# Patient Record
Sex: Female | Born: 1965 | Hispanic: Yes | Marital: Single
Health system: Southern US, Community
[De-identification: ages and names within clinical notes are randomized; demographics above are authoritative.]

---

## 2000-08-24 HISTORY — PX: AUGMENTATION MAMMAPLASTY: SUR837

## 2008-05-15 ENCOUNTER — Ambulatory Visit: Payer: Self-pay | Admitting: Family Medicine

## 2012-07-04 ENCOUNTER — Ambulatory Visit: Payer: Self-pay | Admitting: Family Medicine

## 2013-07-03 ENCOUNTER — Ambulatory Visit: Payer: Self-pay

## 2014-07-25 ENCOUNTER — Ambulatory Visit: Payer: Self-pay

## 2016-01-27 ENCOUNTER — Ambulatory Visit: Payer: Self-pay | Attending: Oncology | Admitting: *Deleted

## 2016-01-27 ENCOUNTER — Other Ambulatory Visit: Payer: Self-pay | Admitting: Oncology

## 2016-01-27 ENCOUNTER — Encounter: Payer: Self-pay | Admitting: *Deleted

## 2016-01-27 ENCOUNTER — Ambulatory Visit
Admission: RE | Admit: 2016-01-27 | Discharge: 2016-01-27 | Disposition: A | Discharge status: Home / Self Care | Payer: Self-pay | Source: Ambulatory Visit | Attending: Oncology | Admitting: Oncology

## 2016-01-27 VITALS — BP 110/84 | HR 76 | Temp 97.7°F | Ht 61.02 in | Wt 160.8 lb

## 2016-01-27 DIAGNOSIS — Z0000 Encounter for general adult medical examination without abnormal findings: Secondary | ICD-10-CM

## 2016-01-27 NOTE — Progress Notes (Signed)
Subjective:     Patient ID: Joan Sanders, female   DOB: 1966-04-14, 50 y.o.   MRN: [id number]  HPI   Review of Systems     Objective:   Physical Exam  Pulmonary/Chest: Right breast exhibits no inverted nipple, no mass, no nipple discharge, no skin change and no tenderness. Left breast exhibits no inverted nipple, no mass, no nipple discharge, no skin change and no tenderness. Breasts are symmetrical.         Assessment:  50 year old English speaking Hispanic [facility].  Last mammo on 07/25/14 was a birads 1.  Clinical breast exam unremarkable.  Taught self breast awareness.  Patient has been screened for eligibility.  She does not have any insurance, Medicare or Medicaid.  She also meets financial eligibility.  Hand-out given on the Affordable Care Act.     Plan:     Screening mammogram ordered.  Patient scheduled to return on 02/05/16 @ 1:00 for her pap smear.  Will follow-up per [facility] protocol.

## 2016-01-27 NOTE — Patient Instructions (Signed)
Gave patient hand-out, Women Staying Healthy, Active and Well from [facility], with education on breast health, pap smears, heart and colon health. 

## 2016-02-05 ENCOUNTER — Ambulatory Visit: Payer: Self-pay | Attending: Internal Medicine

## 2016-02-05 DIAGNOSIS — Z0000 Encounter for general adult medical examination without abnormal findings: Secondary | ICD-10-CM

## 2016-02-05 NOTE — Progress Notes (Signed)
Subjective:     Patient ID: Joan Sanders, female   DOB: 24-Dec-1965, 50 y.o.   MRN: [id number]  HPI   Review of Systems     Objective:   Physical Exam  Genitourinary: No labial fusion. There is no rash, tenderness, lesion or injury on the right labia. There is no rash, tenderness, lesion or injury on the left labia. Uterus is not deviated, not enlarged, not fixed and not tender. Cervix exhibits no motion tenderness, no discharge and no friability. Right adnexum displays no mass, no tenderness and no fullness. Left adnexum displays no mass, no tenderness and no fullness. No erythema, tenderness or bleeding in the vagina. No foreign body around the vagina. No signs of injury around the vagina. No vaginal discharge found.       Assessment:     50 year old female returns for pap. She was on menstrual cycle when she came for screening.  Pelvic exam normal.     Plan:    Specimen collected for pap.

## 2016-02-07 ENCOUNTER — Encounter: Payer: Self-pay | Admitting: *Deleted

## 2016-02-07 LAB — PAP LB AND HPV HIGH-RISK
HPV, high-risk: NEGATIVE
PAP Smear Comment: 0

## 2016-02-07 NOTE — Progress Notes (Signed)
Letter mailed from the [facility] to inform patient of her normal mammogram results.  Patient is to follow-up with annual screening in one year.  [facility] to Christy. 

## 2016-02-16 NOTE — Progress Notes (Signed)
Letter mailed to patient to notify of normal HPV negative pap smear results.  She is to return in one year for [facility] screening.  Per [facility] guidelines her next pap is due in 5 years. Copy to [facility].

## 2018-06-06 ENCOUNTER — Ambulatory Visit: Payer: Self-pay

## 2018-07-18 ENCOUNTER — Ambulatory Visit: Payer: Self-pay

## 2018-07-29 ENCOUNTER — Encounter: Payer: Self-pay | Admitting: Family Medicine

## 2019-07-10 ENCOUNTER — Other Ambulatory Visit: Payer: Self-pay

## 2019-07-12 ENCOUNTER — Ambulatory Visit: Payer: Self-pay | Attending: Oncology | Admitting: *Deleted

## 2019-07-12 ENCOUNTER — Other Ambulatory Visit: Payer: Self-pay

## 2019-07-12 ENCOUNTER — Encounter: Payer: Self-pay | Admitting: *Deleted

## 2019-07-12 ENCOUNTER — Other Ambulatory Visit: Payer: Self-pay | Admitting: *Deleted

## 2019-07-12 ENCOUNTER — Ambulatory Visit
Admission: RE | Admit: 2019-07-12 | Discharge: 2019-07-12 | Disposition: A | Discharge status: Home / Self Care | Payer: Self-pay | Source: Ambulatory Visit | Attending: Oncology | Admitting: Oncology

## 2019-07-12 ENCOUNTER — Encounter (INDEPENDENT_AMBULATORY_CARE_PROVIDER_SITE_OTHER): Payer: Self-pay

## 2019-07-12 VITALS — BP 133/73 | HR 83 | Temp 98.0°F | Resp 16 | Ht 62.5 in | Wt 169.5 lb

## 2019-07-12 DIAGNOSIS — Z0000 Encounter for general adult medical examination without abnormal findings: Secondary | ICD-10-CM

## 2019-07-12 NOTE — Progress Notes (Signed)
  Subjective:     Patient ID: Joan Sanders, female   DOB: 03/13/1966, 53 y.o.   MRN: [id number]  HPI   Review of Systems     Objective:   Physical Exam Chest:     Breasts:        Right: No swelling, bleeding, inverted nipple, mass, nipple discharge, skin change or tenderness.        Left: No swelling, bleeding, inverted nipple, mass, nipple discharge, skin change or tenderness.    Lymphadenopathy:     Upper Body:     Right upper body: No supraclavicular or axillary adenopathy.     Left upper body: No supraclavicular or axillary adenopathy.        Assessment:     53 year old Hispanic female returns to [facility] for clinical breast exam and mammogram.  Joan Sanders, the interpreter present during the interview and exam.  Clinical breast exam reveals scars from previous surgery 18 years ago for implants.  No dominant mass, skin changes or lymphadenopathy.  Taught self breast awareness.  Last pap on 05/05/18 was negative without HPV co-testing.  Next pap due in 2022.  Patient has been screened for eligibility.  She does not have any insurance, Medicare or Medicaid.  She also meets financial eligibility.  Hand-out given on the Affordable [place] Act. Risk Assessment    No risk assessment data for the current encounter   Risk Scores      07/10/2019   Last edited by: Orson Slick, CMA   5-year risk: 0.6 %   Lifetime risk: 4.3 %            Plan:     Screening mammogram ordered.  Will follow-up per [facility] protocol.

## 2019-07-13 ENCOUNTER — Encounter: Payer: Self-pay | Admitting: *Deleted

## 2019-07-13 NOTE — Progress Notes (Signed)
Letter mailed from the [facility] to inform patient of her normal mammogram results.  Patient is to follow-up with annual screening in one year.  [facility] to Christy. 

## 2020-07-19 IMAGING — MG DIGITAL SCREENING IMPLANTS W/ CAD
8 of 14 series · 8 of 34 positions shown · non-contrast
Comparison: Previous exam(s).

CLINICAL DATA: Screening.

EXAM:
DIGITAL SCREENING BILATERAL MAMMOGRAM WITH IMPLANTS AND CAD
The patient has retropectoral implants. Standard and implant
displaced views were performed.

[R MLO]
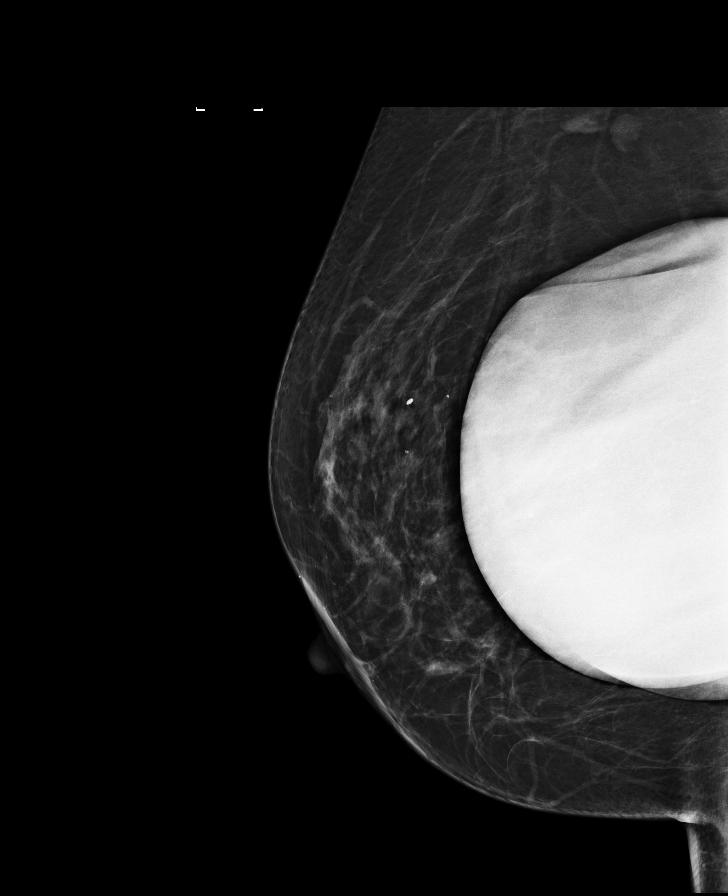

[R CC]
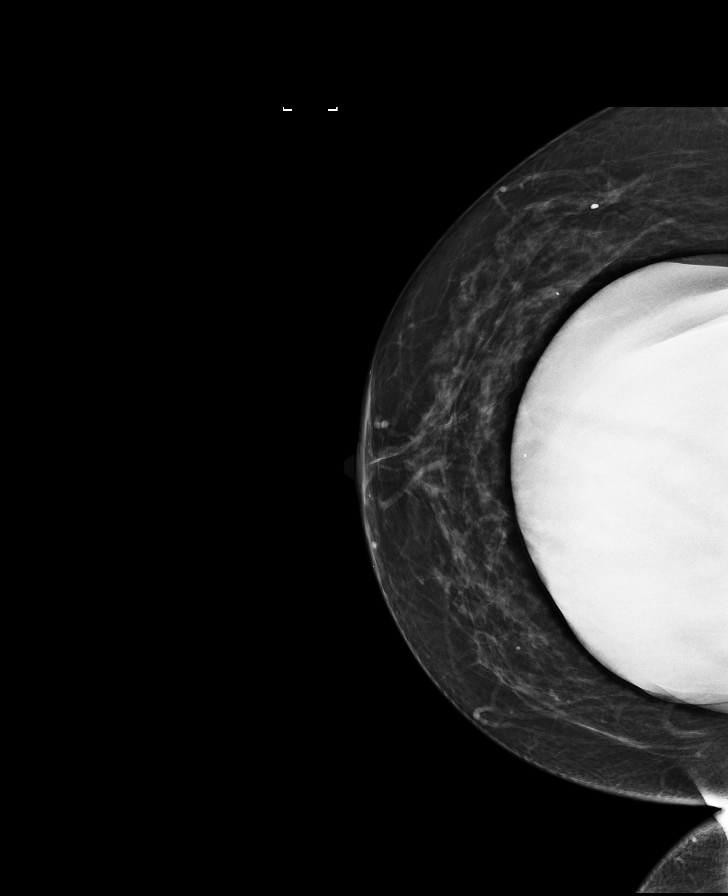

[L CC]
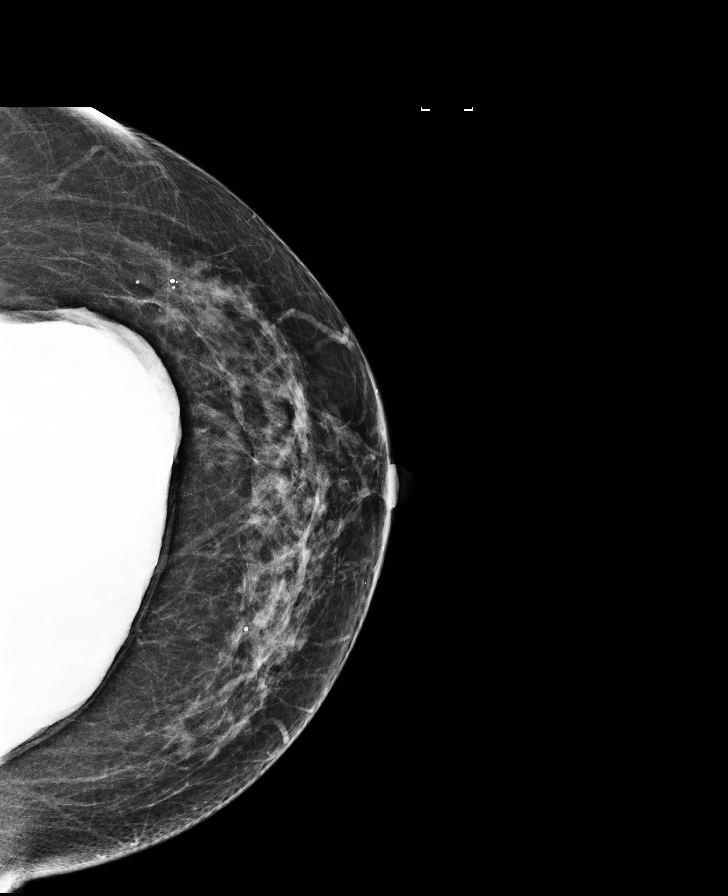

[L MLO]
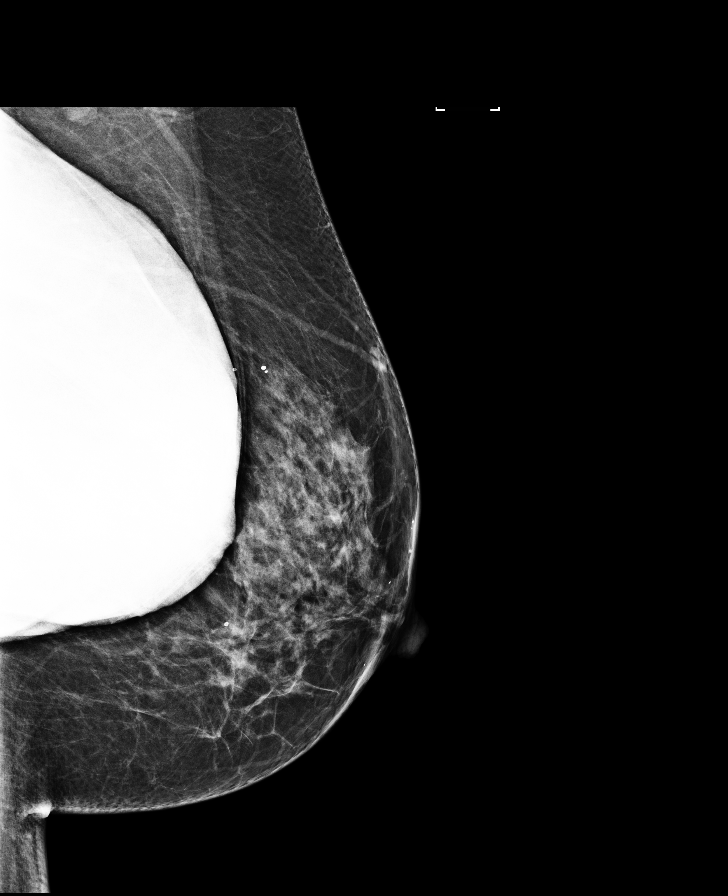

[R CC synth-2D]
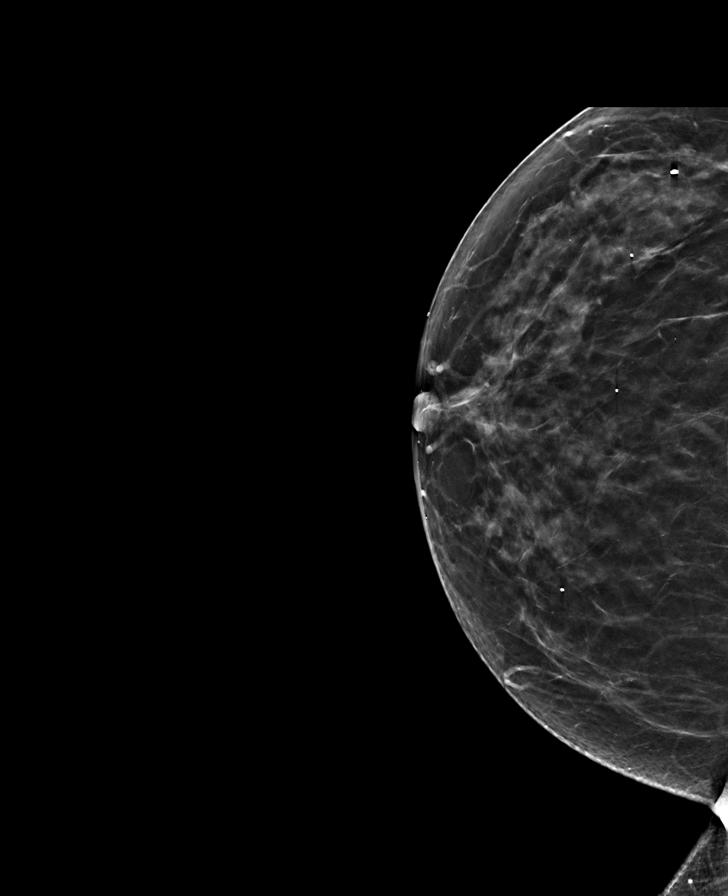

[L MLO synth-2D]
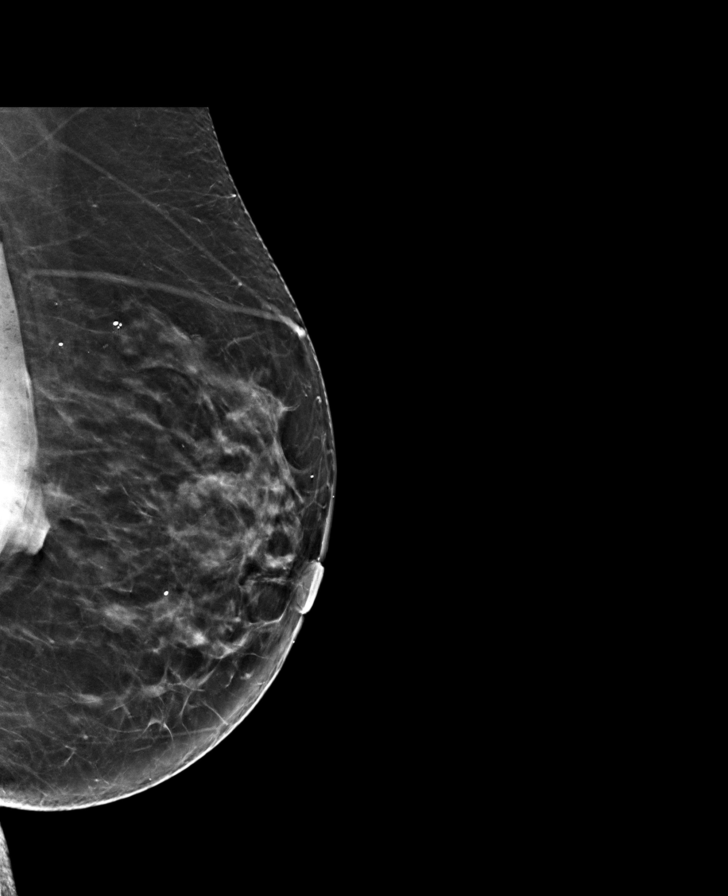

[L CC synth-2D]
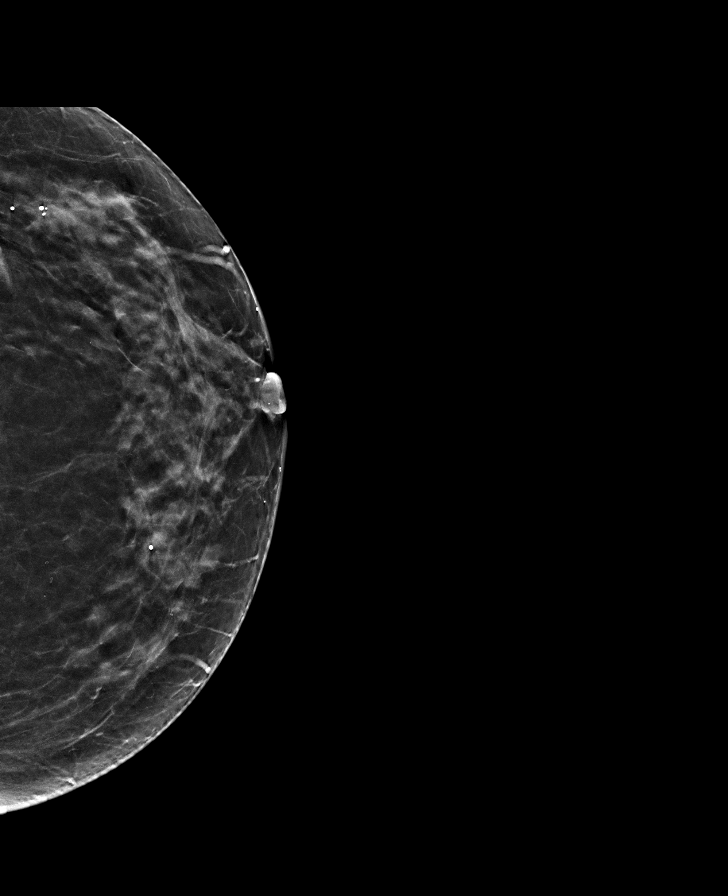

[R MLO synth-2D]
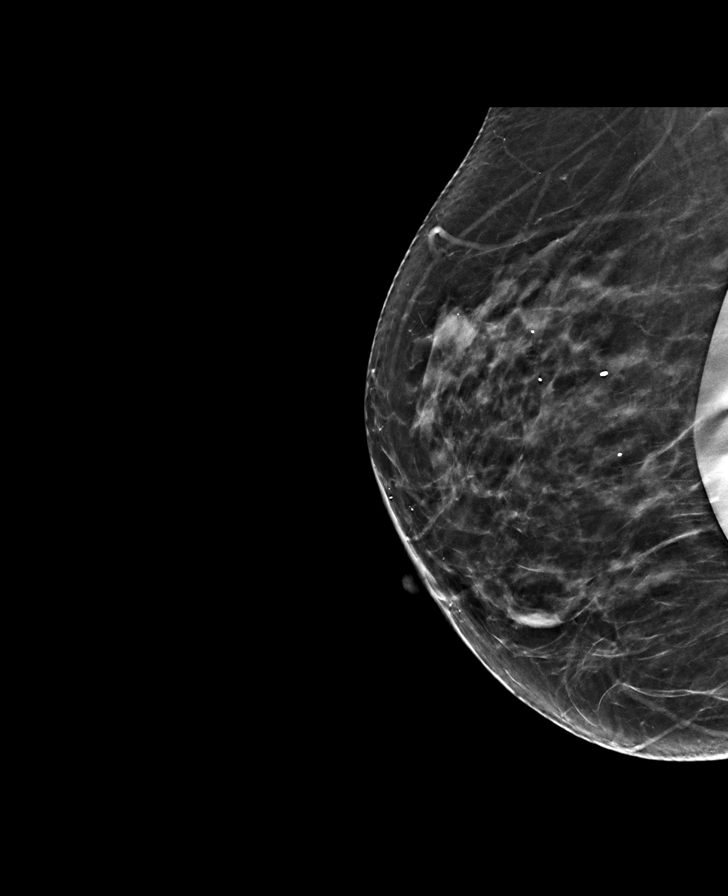

[8 of 34 positions shown; findings below may reference images not displayed]

ACR Breast Density Category c: The breast tissue is heterogeneously
dense, which may obscure small masses.
FINDINGS: There are no findings suspicious for malignancy. Images were
processed with CAD.
IMPRESSION: No mammographic evidence of malignancy. A result letter of this
screening mammogram will be mailed directly to the patient.

RECOMMENDATION:
Screening mammogram in one year. (Code:R9-8-RFM)

BI-RADS CATEGORY  1:  Negative.

## 2022-10-05 ENCOUNTER — Other Ambulatory Visit: Payer: Self-pay | Admitting: Family Medicine

## 2022-10-05 DIAGNOSIS — Z1231 Encounter for screening mammogram for malignant neoplasm of breast: Secondary | ICD-10-CM

## 2022-10-23 ENCOUNTER — Ambulatory Visit
Admission: RE | Admit: 2022-10-23 | Discharge: 2022-10-23 | Disposition: A | Discharge status: Home / Self Care | Payer: Medicare Other | Source: Ambulatory Visit | Attending: Family Medicine | Admitting: Family Medicine

## 2022-10-23 DIAGNOSIS — Z1231 Encounter for screening mammogram for malignant neoplasm of breast: Secondary | ICD-10-CM

## 2023-12-01 ENCOUNTER — Encounter: Payer: Self-pay | Admitting: Family Medicine

## 2023-12-22 ENCOUNTER — Encounter

## 2024-01-21 ENCOUNTER — Ambulatory Visit

## 2024-01-21 DIAGNOSIS — Z1211 Encounter for screening for malignant neoplasm of colon: Secondary | ICD-10-CM | POA: Diagnosis not present

## 2024-01-21 DIAGNOSIS — K640 First degree hemorrhoids: Secondary | ICD-10-CM | POA: Diagnosis not present

## 2024-03-28 ENCOUNTER — Other Ambulatory Visit: Payer: Self-pay | Admitting: Family Medicine

## 2024-03-28 DIAGNOSIS — Z1231 Encounter for screening mammogram for malignant neoplasm of breast: Secondary | ICD-10-CM

## 2024-04-13 ENCOUNTER — Ambulatory Visit
Admission: RE | Admit: 2024-04-13 | Discharge: 2024-04-13 | Disposition: A | Discharge status: Home / Self Care | Source: Ambulatory Visit | Attending: Family Medicine | Admitting: Family Medicine

## 2024-04-13 DIAGNOSIS — Z1231 Encounter for screening mammogram for malignant neoplasm of breast: Secondary | ICD-10-CM | POA: Insufficient documentation
